# Patient Record
Sex: Male | Born: 1960 | Race: White | Hispanic: No | Marital: Married | Smoking: Never smoker
Health system: Southern US, Community
[De-identification: ages and names within clinical notes are randomized; demographics above are authoritative.]

## PROBLEM LIST (undated history)

## (undated) DIAGNOSIS — G20A1 Parkinson's disease without dyskinesia, without mention of fluctuations: Secondary | ICD-10-CM

## (undated) DIAGNOSIS — I10 Essential (primary) hypertension: Secondary | ICD-10-CM

## (undated) DIAGNOSIS — G20 Parkinson's disease: Secondary | ICD-10-CM

## (undated) DIAGNOSIS — G47419 Narcolepsy without cataplexy: Secondary | ICD-10-CM

## (undated) HISTORY — PX: VASECTOMY: SHX75

## (undated) HISTORY — PX: VARICOSE VEIN SURGERY: SHX832

---

## 2014-02-20 ENCOUNTER — Ambulatory Visit: Payer: Self-pay | Admitting: Physical Medicine and Rehabilitation

## 2015-12-02 ENCOUNTER — Emergency Department
Admission: EM | Admit: 2015-12-02 | Discharge: 2015-12-02 | Disposition: A | Discharge status: Home / Self Care | Payer: BLUE CROSS/BLUE SHIELD | Attending: Emergency Medicine | Admitting: Emergency Medicine

## 2015-12-02 ENCOUNTER — Emergency Department: Payer: BLUE CROSS/BLUE SHIELD

## 2015-12-02 ENCOUNTER — Encounter: Payer: Self-pay | Admitting: Emergency Medicine

## 2015-12-02 DIAGNOSIS — I10 Essential (primary) hypertension: Secondary | ICD-10-CM | POA: Insufficient documentation

## 2015-12-02 DIAGNOSIS — R55 Syncope and collapse: Secondary | ICD-10-CM

## 2015-12-02 DIAGNOSIS — Y9389 Activity, other specified: Secondary | ICD-10-CM | POA: Diagnosis not present

## 2015-12-02 DIAGNOSIS — S0181XA Laceration without foreign body of other part of head, initial encounter: Secondary | ICD-10-CM

## 2015-12-02 DIAGNOSIS — W1839XA Other fall on same level, initial encounter: Secondary | ICD-10-CM | POA: Diagnosis not present

## 2015-12-02 DIAGNOSIS — Y998 Other external cause status: Secondary | ICD-10-CM | POA: Diagnosis not present

## 2015-12-02 DIAGNOSIS — Y92002 Bathroom of unspecified non-institutional (private) residence single-family (private) house as the place of occurrence of the external cause: Secondary | ICD-10-CM | POA: Diagnosis not present

## 2015-12-02 DIAGNOSIS — R112 Nausea with vomiting, unspecified: Secondary | ICD-10-CM | POA: Diagnosis not present

## 2015-12-02 DIAGNOSIS — S0990XA Unspecified injury of head, initial encounter: Secondary | ICD-10-CM | POA: Diagnosis not present

## 2015-12-02 DIAGNOSIS — R197 Diarrhea, unspecified: Secondary | ICD-10-CM | POA: Diagnosis not present

## 2015-12-02 HISTORY — DX: Essential (primary) hypertension: I10

## 2015-12-02 HISTORY — DX: Parkinson's disease: G20

## 2015-12-02 HISTORY — DX: Narcolepsy without cataplexy: G47.419

## 2015-12-02 HISTORY — DX: Parkinson's disease without dyskinesia, without mention of fluctuations: G20.A1

## 2015-12-02 LAB — CBC
HCT: 44.9 % (ref 40.0–52.0)
HEMOGLOBIN: 14.8 g/dL (ref 13.0–18.0)
MCH: 30.5 pg (ref 26.0–34.0)
MCHC: 33.1 g/dL (ref 32.0–36.0)
MCV: 92.2 fL (ref 80.0–100.0)
Platelets: 278 10*3/uL (ref 150–440)
RBC: 4.87 MIL/uL (ref 4.40–5.90)
RDW: 14.3 % (ref 11.5–14.5)
WBC: 19 10*3/uL — AB (ref 3.8–10.6)

## 2015-12-02 LAB — COMPREHENSIVE METABOLIC PANEL
ALK PHOS: 100 U/L (ref 38–126)
ALT: 9 U/L — ABNORMAL LOW (ref 17–63)
ANION GAP: 10 (ref 5–15)
AST: 20 U/L (ref 15–41)
Albumin: 3.5 g/dL (ref 3.5–5.0)
BILIRUBIN TOTAL: 0.8 mg/dL (ref 0.3–1.2)
BUN: 20 mg/dL (ref 6–20)
CALCIUM: 8.6 mg/dL — AB (ref 8.9–10.3)
CO2: 25 mmol/L (ref 22–32)
Chloride: 103 mmol/L (ref 101–111)
Creatinine, Ser: 1.22 mg/dL (ref 0.61–1.24)
Glucose, Bld: 170 mg/dL — ABNORMAL HIGH (ref 65–99)
Potassium: 3.1 mmol/L — ABNORMAL LOW (ref 3.5–5.1)
SODIUM: 138 mmol/L (ref 135–145)
TOTAL PROTEIN: 6.6 g/dL (ref 6.5–8.1)

## 2015-12-02 LAB — LIPASE, BLOOD: Lipase: 19 U/L (ref 11–51)

## 2015-12-02 MED ORDER — LOPERAMIDE HCL 2 MG PO CAPS
4.0000 mg | ORAL_CAPSULE | Freq: Once | ORAL | Status: AC
  Administered 2015-12-02: 4 mg via ORAL
  Filled 2015-12-02: qty 2

## 2015-12-02 MED ORDER — SODIUM CHLORIDE 0.9 % IV BOLUS (SEPSIS)
1000.0000 mL | Freq: Once | INTRAVENOUS | Status: AC
  Administered 2015-12-02: 1000 mL via INTRAVENOUS

## 2015-12-02 MED ORDER — LIDOCAINE HCL (PF) 1 % IJ SOLN
10.0000 mL | Freq: Once | INTRAMUSCULAR | Status: AC
  Administered 2015-12-02: 10 mL via INTRADERMAL

## 2015-12-02 MED ORDER — ONDANSETRON HCL 4 MG/2ML IJ SOLN
4.0000 mg | Freq: Once | INTRAMUSCULAR | Status: AC | PRN
  Administered 2015-12-02: 4 mg via INTRAVENOUS

## 2015-12-02 MED ORDER — LIDOCAINE HCL (PF) 1 % IJ SOLN
INTRAMUSCULAR | Status: AC
  Administered 2015-12-02: 10 mL via INTRADERMAL
  Filled 2015-12-02: qty 10

## 2015-12-02 MED ORDER — ONDANSETRON HCL 4 MG/2ML IJ SOLN
4.0000 mg | Freq: Once | INTRAMUSCULAR | Status: AC | PRN
Start: 2015-12-02 — End: 2015-12-02
  Administered 2015-12-02: 4 mg via INTRAVENOUS
  Filled 2015-12-02 (×2): qty 2

## 2015-12-02 NOTE — ED Notes (Signed)
MD at bedside. 

## 2015-12-02 NOTE — ED Notes (Signed)
With MD at bedside for stitch placement.

## 2015-12-02 NOTE — ED Notes (Signed)
Pt alert and oriented X4, active, cooperative, pt in NAD. RR even and unlabored, color WNL.  Pt informed to return if any life threatening symptoms occur.   

## 2015-12-02 NOTE — ED Notes (Signed)
Pt presents to ED via [facility] from home, c/o laceration above left eyebrow, abdominal pain and diarrhea. Pt reports diarrhea began 7 pm last night. Pt reports was up using bathroom when pt became hot and flushed and states the next thing he remembers was waking up on the bathroom floor. Pt daughter reports finding pt unresponsive on the bathroom floor. Per EMS fire department found pt conscious but not alert. Upon EMS arrival pt alert and oriented x 4. Pt arrived with bandage to head, bleeding controlled. Pt alert and oriented x 4, no increased work in breathing noted, skin warm and dry.

## 2015-12-02 NOTE — ED Notes (Signed)
Patient transported to CT 

## 2015-12-02 NOTE — ED Notes (Signed)
Report from Kuch, RN 

## 2015-12-02 NOTE — Discharge Instructions (Signed)
Diarrhea Diarrhea is frequent loose and watery bowel movements. It can cause you to feel weak and dehydrated. Dehydration can cause you to become tired and thirsty, have a dry mouth, and have decreased urination that often is dark yellow. Diarrhea is a sign of another problem, most often an infection that will not last long. In most cases, diarrhea typically lasts 2-3 days. However, it can last longer if it is a sign of something more serious. It is important to treat your diarrhea as directed by your caregiver to lessen or prevent future episodes of diarrhea. CAUSES  Some common causes include:  Gastrointestinal infections caused by viruses, bacteria, or parasites.  Food poisoning or food allergies.  Certain medicines, such as antibiotics, chemotherapy, and laxatives.  Artificial sweeteners and fructose.  Digestive disorders. HOME CARE INSTRUCTIONS  Ensure adequate fluid intake (hydration): Have 1 cup (8 oz) of fluid for each diarrhea episode. Avoid fluids that contain simple sugars or sports drinks, fruit juices, whole milk products, and sodas. Your urine should be clear or pale yellow if you are drinking enough fluids. Hydrate with an oral rehydration solution that you can purchase at pharmacies, retail stores, and online. You can prepare an oral rehydration solution at home by mixing the following ingredients together:   - tsp table salt.   tsp baking soda.   tsp salt substitute containing potassium chloride.  1  tablespoons sugar.  1 L (34 oz) of water.  Certain foods and beverages may increase the speed at which food moves through the gastrointestinal (GI) tract. These foods and beverages should be avoided and include:  Caffeinated and alcoholic beverages.  High-fiber foods, such as raw fruits and vegetables, nuts, seeds, and whole grain breads and cereals.  Foods and beverages sweetened with sugar alcohols, such as xylitol, sorbitol, and mannitol.  Some foods may be well  tolerated and may help thicken stool including:  Starchy foods, such as rice, toast, pasta, low-sugar cereal, oatmeal, grits, baked potatoes, crackers, and bagels.  Bananas.  Applesauce.  Add probiotic-rich foods to help increase healthy bacteria in the GI tract, such as yogurt and fermented milk products.  Wash your hands well after each diarrhea episode.  Only take over-the-counter or prescription medicines as directed by your caregiver.  Take a warm bath to relieve any burning or pain from frequent diarrhea episodes. SEEK IMMEDIATE MEDICAL CARE IF:   You are unable to keep fluids down.  You have persistent vomiting.  You have blood in your stool, or your stools are black and tarry.  You do not urinate in 6-8 hours, or there is only a small amount of very dark urine.  You have abdominal pain that increases or localizes.  You have weakness, dizziness, confusion, or light-headedness.  You have a severe headache.  Your diarrhea gets worse or does not get better.  You have a fever or persistent symptoms for more than 2-3 days.  You have a fever and your symptoms suddenly get worse. MAKE SURE YOU:   Understand these instructions.  Will watch your condition.  Will get help right away if you are not doing well or get worse.   This information is not intended to replace advice given to you by your health care provider. Make sure you discuss any questions you have with your health care provider.   Document Released: 09/22/2002 Document Revised: 10/23/2014 Document Reviewed: 06/09/2012 Elsevier Interactive Patient Education 2016 [employer]  Facial Laceration Please have sutures removed in 7 days  A  facial laceration is a cut on the face. These injuries can be painful and cause bleeding. Lacerations usually heal quickly, but they need special care to reduce scarring. DIAGNOSIS  Your health care provider will take a medical history, ask for details about how the injury  occurred, and examine the wound to determine how deep the cut is. TREATMENT  Some facial lacerations may not require closure. Others may not be able to be closed because of an increased risk of infection. The risk of infection and the chance for successful closure will depend on various factors, including the amount of time since the injury occurred. The wound may be cleaned to help prevent infection. If closure is appropriate, pain medicines may be given if needed. Your health care provider will use stitches (sutures), wound glue (adhesive), or skin adhesive strips to repair the laceration. These tools bring the skin edges together to allow for faster healing and a better cosmetic outcome. If needed, you may also be given a tetanus shot. HOME CARE INSTRUCTIONS  Only take over-the-counter or prescription medicines as directed by your health care provider.  Follow your health care provider's instructions for wound care. These instructions will vary depending on the technique used for closing the wound. For Sutures:  Keep the wound clean and dry.   If you were given a bandage (dressing), you should change it at least once a day. Also change the dressing if it becomes wet or dirty, or as directed by your health care provider.   Wash the wound with soap and water 2 times a day. Rinse the wound off with water to remove all soap. Pat the wound dry with a clean towel.   After cleaning, apply a thin layer of the antibiotic ointment recommended by your health care provider. This will help prevent infection and keep the dressing from sticking.   You may shower as usual after the first 24 hours. Do not soak the wound in water until the sutures are removed.   Get your sutures removed as directed by your health care provider. With facial lacerations, sutures should usually be taken out after 4-5 days to avoid stitch marks.   Wait a few days after your sutures are removed before applying any  makeup. For Skin Adhesive Strips:  Keep the wound clean and dry.   Do not get the skin adhesive strips wet. You may bathe carefully, using caution to keep the wound dry.   If the wound gets wet, pat it dry with a clean towel.   Skin adhesive strips will fall off on their own. You may trim the strips as the wound heals. Do not remove skin adhesive strips that are still stuck to the wound. They will fall off in time.  For Wound Adhesive:  You may briefly wet your wound in the shower or bath. Do not soak or scrub the wound. Do not swim. Avoid periods of heavy sweating until the skin adhesive has fallen off on its own. After showering or bathing, gently pat the wound dry with a clean towel.   Do not apply liquid medicine, cream medicine, ointment medicine, or makeup to your wound while the skin adhesive is in place. This may loosen the film before your wound is healed.   If a dressing is placed over the wound, be careful not to apply tape directly over the skin adhesive. This may cause the adhesive to be pulled off before the wound is healed.   Avoid prolonged exposure to  sunlight or tanning lamps while the skin adhesive is in place.  The skin adhesive will usually remain in place for 5-10 days, then naturally fall off the skin. Do not pick at the adhesive film.  After Healing: Once the wound has healed, cover the wound with sunscreen during the day for 1 full year. This can help minimize scarring. Exposure to ultraviolet light in the first year will darken the scar. It can take 1-2 years for the scar to lose its redness and to heal completely.  SEEK MEDICAL CARE IF:  You have a fever. SEEK IMMEDIATE MEDICAL CARE IF:  You have redness, pain, or swelling around the wound.   You see ayellowish-white fluid (pus) coming from the wound.    This information is not intended to replace advice given to you by your health care provider. Make sure you discuss any questions you have with  your health care provider.   Document Released: 11/09/2004 Document Revised: 10/23/2014 Document Reviewed: 05/15/2013 Elsevier Interactive Patient Education 2016 [employer].  Syncope, commonly known as fainting, is a temporary loss of consciousness. It occurs when the blood flow to the brain is reduced. Vasovagal syncope (also called neurocardiogenic syncope) is a fainting spell in which the blood flow to the brain is reduced because of a sudden drop in heart rate and blood pressure. Vasovagal syncope occurs when the brain and the cardiovascular system (blood vessels) do not adequately communicate and respond to each other. This is the most common cause of fainting. It often occurs in response to fear or some other type of emotional or physical stress. The body has a reaction in which the heart starts beating too slowly or the blood vessels expand, reducing blood pressure. This type of fainting spell is generally considered harmless. However, injuries can occur if a person takes a sudden fall during a fainting spell.  CAUSES  Vasovagal syncope occurs when a person's blood pressure and heart rate decrease suddenly, usually in response to a trigger. Many things and situations can trigger an episode. Some of these include:   Pain.   Fear.   The sight of blood or medical procedures, such as blood being drawn from a vein.   Common activities, such as coughing, swallowing, stretching, or going to the bathroom.   Emotional stress.   Prolonged standing, especially in a warm environment.   Lack of sleep or rest.   Prolonged lack of food.   Prolonged lack of fluids.   Recent illness.  The use of certain drugs that affect blood pressure, such as cocaine, alcohol, marijuana, inhalants, and opiates.  SYMPTOMS  Before the fainting episode, you may:   Feel dizzy or light headed.   Become pale.  Sense that you are going to faint.   Feel like the room is spinning.   Have  tunnel vision, only seeing directly in front of you.   Feel sick to your stomach (nauseous).   See spots or slowly lose vision.   Hear ringing in your ears.   Have a headache.   Feel warm and sweaty.   Feel a sensation of pins and needles. During the fainting spell, you will generally be unconscious for no longer than a couple minutes before waking up and returning to normal. If you get up too quickly before your body can recover, you may faint again. Some twitching or jerky movements may occur during the fainting spell.  DIAGNOSIS  Your health care provider will ask about your symptoms, take a  medical history, and perform a physical exam. Various tests may be done to rule out other causes of fainting. These may include blood tests and tests to check the heart, such as electrocardiography, echocardiography, and possibly an electrophysiology study. When other causes have been ruled out, a test may be done to check the body's response to changes in position (tilt table test). TREATMENT  Most cases of vasovagal syncope do not require treatment. Your health care provider may recommend ways to avoid fainting triggers and may provide home strategies for preventing fainting. If you must be exposed to a possible trigger, you can drink additional fluids to help reduce your chances of having an episode of vasovagal syncope. If you have warning signs of an oncoming episode, you can respond by positioning yourself favorably (lying down). If your fainting spells continue, you may be given medicines to prevent fainting. Some medicines may help make you more resistant to repeated episodes of vasovagal syncope. Special exercises or compression stockings may be recommended. In rare cases, the surgical placement of a pacemaker is considered. HOME CARE INSTRUCTIONS   Learn to identify the warning signs of vasovagal syncope.   Sit or lie down at the first warning sign of a fainting spell. If sitting, put  your head down between your legs. If you lie down, swing your legs up in the air to increase blood flow to the brain.   Avoid hot tubs and saunas.  Avoid prolonged standing.  Drink enough fluids to keep your urine clear or pale yellow. Avoid caffeine.  Increase salt in your diet as directed by your health care provider.   If you have to stand for a long time, perform movements such as:   Crossing your legs.   Flexing and stretching your leg muscles.   Squatting.   Moving your legs.   Bending over.   Only take over-the-counter or prescription medicines as directed by your health care provider. Do not suddenly stop any medicines without asking your health care provider first. SEEK MEDICAL CARE IF:   Your fainting spells continue or happen more frequently in spite of treatment.   You lose consciousness for more than a couple minutes.  You have fainting spells during or after exercising or after being startled.   You have new symptoms that occur with the fainting spells, such as:   Shortness of breath.  Chest pain.   Irregular heartbeat.   You have episodes of twitching or jerky movements that last longer than a few seconds.  You have episodes of twitching or jerky movements without obvious fainting. SEEK IMMEDIATE MEDICAL CARE IF:   You have injuries or bleeding after a fainting spell.   You have episodes of twitching or jerky movements that last longer than 5 minutes.   You have more than one spell of twitching or jerky movements before returning to consciousness after fainting.   This information is not intended to replace advice given to you by your health care provider. Make sure you discuss any questions you have with your health care provider.   Document Released: 09/18/2012 Document Revised: 02/16/2015 Document Reviewed: 09/18/2012 Elsevier Interactive Patient Education [employer]

## 2015-12-02 NOTE — ED Provider Notes (Signed)
[facility] Emergency Department Provider Note  ____________________________________________  Time seen: 2:05 AM  I have reviewed the triage vital signs and the nursing notes.   HISTORY  Chief Complaint Loss of Consciousness and Abdominal Pain      HPI Jay Saunders is a 55 y.o. male presents via EMS status post syncopal episode while having diarrhea tonight. Patient states he has been having diarrhea every 30 minutes since 7 PM last night. He states that he awoke from bed and went to the bathroom and was having diarrhea and then "passed out". Patient denies any preceding symptoms other than diarrhea before the syncopal event. Patient denies any abdominal pain but does admit to nausea and vomiting. Patient denies any fever. Per the patient's wife on arrival to the bathroom the patient's face was on the floor with evidence of bleeding. She states that her husband was nonresponsive to verbal stimuli at that time but gradually came around and became appropriate.     Past Medical History  Diagnosis Date  . Parkinson's disease (HCC)   . Hypertension   . Narcolepsy     There are no active problems to display for this patient.   History reviewed. No pertinent past surgical history.  No current outpatient prescriptions on file.  Allergies Review of patient's allergies indicates no known allergies.  No family history on file.  Social History Social History  Substance Use Topics  . Smoking status: Never Smoker   . Smokeless tobacco: None  . Alcohol Use: No    Review of Systems  Constitutional: Negative for fever. Eyes: Negative for visual changes. ENT: Negative for sore throat. Cardiovascular: Negative for chest pain. Respiratory: Negative for shortness of breath. Gastrointestinal: Negative for abdominal pain. Positive for vomiting and diarrhea Genitourinary: Negative for dysuria. Musculoskeletal: Negative for back pain. Skin: Negative for rash.  Positive for forehead laceration Neurological: Negative for headaches, focal weakness or numbness. Positive for syncope   10-point ROS otherwise negative.  ____________________________________________   PHYSICAL EXAM:  VITAL SIGNS: ED Triage Vitals  Enc Vitals Group     BP 12/02/15 0146 105/70 mmHg     Pulse Rate 12/02/15 0145 68     Resp 12/02/15 0145 20     Temp 12/02/15 0146 97.8 F (36.6 C)     Temp Source 12/02/15 0146 Oral     SpO2 12/02/15 0145 96 %     Weight 12/02/15 0146 265 lb (120.203 kg)     Height 12/02/15 0146 6' (1.829 m)     Head Cir --      Peak Flow --      Pain Score 12/02/15 0147 4     Pain Loc --      Pain Edu? --      Excl. in GC? --      Constitutional: Alert and oriented. Well appearing and in no distress. Eyes: Conjunctivae are normal. PERRL. Normal extraocular movements. ENT   Head: Normocephalic and atraumatic.   Nose: No congestion/rhinnorhea.   Mouth/Throat: Mucous membranes are moist.   Neck: No stridor. Hematological/Lymphatic/Immunilogical: No cervical lymphadenopathy. Cardiovascular: Normal rate, regular rhythm. Normal and symmetric distal pulses are present in all extremities. No murmurs, rubs, or gallops. Respiratory: Normal respiratory effort without tachypnea nor retractions. Breath sounds are clear and equal bilaterally. No wheezes/rales/rhonchi. Gastrointestinal: Soft and nontender. No distention. There is no CVA tenderness. Genitourinary: deferred Musculoskeletal: Nontender with normal range of motion in all extremities. No joint effusions.  No lower extremity tenderness nor edema.  Neurologic:  Normal speech and language. No gross focal neurologic deficits are appreciated. Speech is normal.  Skin:  Skin is warm, dry and intact. 1 inch laceration medial to the left eyebrow Psychiatric: Mood and affect are normal. Speech and behavior are normal. Patient exhibits appropriate insight and  judgment.  ____________________________________________    LABS (pertinent positives/negatives)  Labs Reviewed  COMPREHENSIVE METABOLIC PANEL - Abnormal; Notable for the following:    Potassium 3.1 (*)    Glucose, Bld 170 (*)    Calcium 8.6 (*)    ALT 9 (*)    All other components within normal limits  CBC - Abnormal; Notable for the following:    WBC 19.0 (*)    All other components within normal limits  LIPASE, BLOOD  URINALYSIS COMPLETEWITH MICROSCOPIC ([facility] ONLY)     ____________________________________________   EKG ED ECG REPORT I, BROWN, [place], the attending physician, personally viewed and interpreted this ECG.   Date: 12/02/2015  EKG Time: 1:41 AM  Rate: 91  Rhythm: Normal sinus rhythm  Axis: Normal  Intervals: Normal  ST&T Change: None   ____________________________________________    RADIOLOGY CT Head Wo Contrast (Final result) Result time: 12/02/15 02:46:39   Final result by Rad Results In Interface (12/02/15 02:46:39)   Narrative:   CLINICAL DATA: 55 year old male with fall and left forehead laceration  EXAM: CT HEAD WITHOUT CONTRAST  TECHNIQUE: Contiguous axial images were obtained from the base of the skull through the vertex without intravenous contrast.  COMPARISON: Brain MRI dated 02/20/2014  FINDINGS: The ventricles and sulci are appropriate in size for patient's age. Minimal periventricular and deep white matter chronic microvascular ischemic changes noted. There is no acute intracranial hemorrhage. No mass effect or midline shift.  There is diffuse mucoperiosteal thickening of paranasal sinuses with partial opacification of the maxillary sinuses. The mastoid air cells are clear. The calvarium is intact. Left forehead skin laceration.  IMPRESSION: No acute intracranial hemorrhage.   Electronically Signed By: Elgie Collard M.D. On: 12/02/2015 02:46      Procedure note:LACERATION REPAIR Performed by:  Darci Current Authorized by: Darci Current Consent: Verbal consent obtained. Risks and benefits: risks, benefits and alternatives were discussed Consent given by: patient Patient identity confirmed: provided demographic data Prepped and Draped in normal sterile fashion Wound explored  Laceration Location: left forehead   Laceration Length: 1 inch  No Foreign Bodies seen or palpated  Anesthesia: local infiltration  Local anesthetic: lidocaine 1%   Anesthetic total: 5 ml  Irrigation method: syringe Amount of cleaning: standard  Skin closure: 6-0 nylon   Number of sutures:  10  Technique: Simple Interrupted  Patient tolerance: Patient tolerated the procedure well with no immediate complications.   INITIAL IMPRESSION / ASSESSMENT AND PLAN / ED COURSE  Pertinent labs & imaging results that were available during my care of the patient were reviewed by me and considered in my medical decision making (see chart for details).  History of physical exam consistent with vasovagal syncope, diarrhea most likely viral etiology and facial laceration secondary to syncopal episode.  ____________________________________________   FINAL CLINICAL IMPRESSION(S) / ED DIAGNOSES  Final diagnoses:  Forehead laceration, initial encounter  Vasovagal syncope  Diarrhea, unspecified type      Darci Current, MD 12/02/15 [phone]

## 2016-03-27 ENCOUNTER — Other Ambulatory Visit: Payer: Self-pay | Admitting: Family Medicine

## 2016-03-27 ENCOUNTER — Ambulatory Visit
Admission: RE | Admit: 2016-03-27 | Discharge: 2016-03-27 | Disposition: A | Discharge status: Home / Self Care | Payer: BLUE CROSS/BLUE SHIELD | Source: Ambulatory Visit | Attending: Family Medicine | Admitting: Family Medicine

## 2016-03-27 DIAGNOSIS — M79604 Pain in right leg: Secondary | ICD-10-CM | POA: Diagnosis present

## 2016-03-27 DIAGNOSIS — I82811 Embolism and thrombosis of superficial veins of right lower extremities: Secondary | ICD-10-CM | POA: Insufficient documentation

## 2016-09-03 IMAGING — US US EXTREM LOW VENOUS*R*
1 series · 13 of 24 positions shown · non-contrast
Comparison: None.

CLINICAL DATA: Right lower extremity pain for 1 day

EXAM:
RIGHT LOWER EXTREMITY VENOUS DUPLEX ULTRASOUND
TECHNIQUE: Gray-scale sonography with graded compression, as well as color
Doppler and duplex ultrasound were performed to evaluate the right
lower extremity deep venous system from the level of the common
femoral vein and including the common femoral, femoral, profunda
femoral, popliteal and calf veins including the posterior tibial,
peroneal and gastrocnemius veins when visible. The superficial great
saphenous vein was also interrogated. Spectral Doppler was utilized
to evaluate flow at rest and with distal augmentation maneuvers in
the common femoral, femoral and popliteal veins.

[Series 1: us extrem low venous*right* · 0.09mm/px · 13 of 49 slices shown]
[im 1/49]
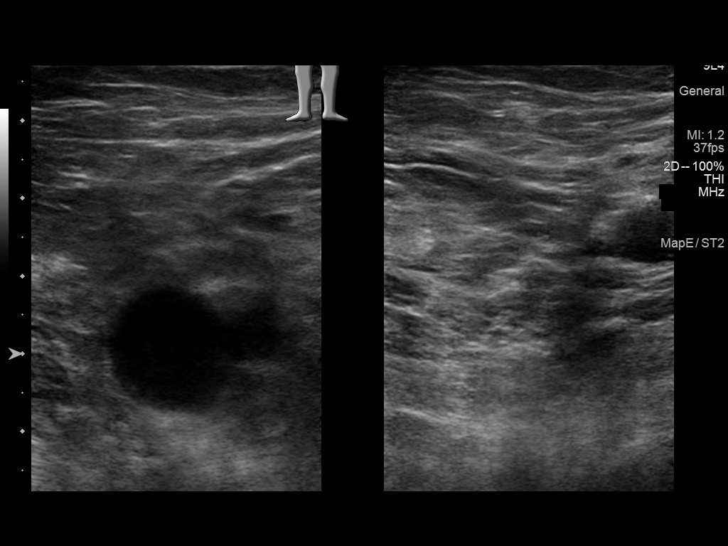
[im 5/49]
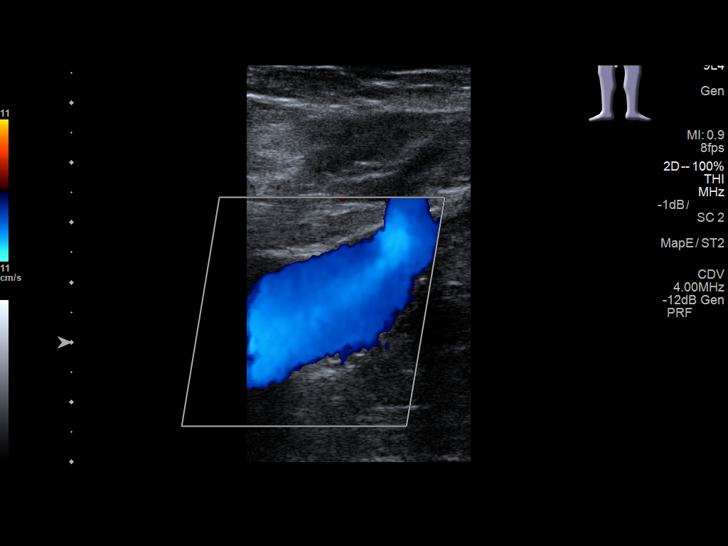
[im 9/49]
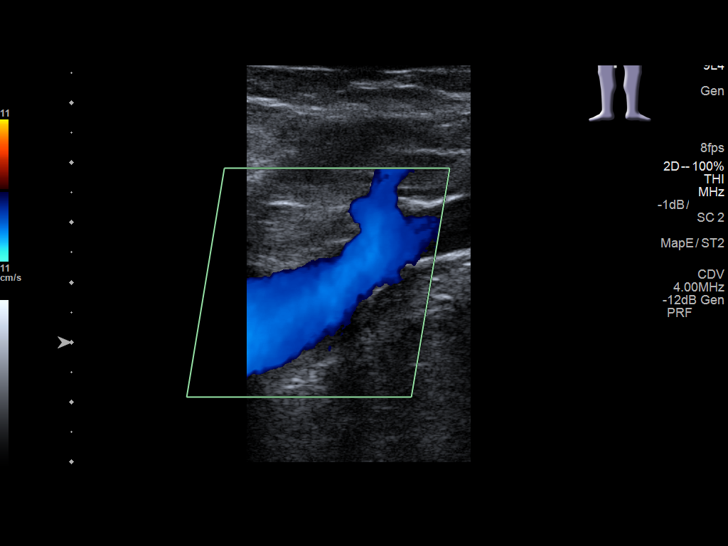
[im 13/49]
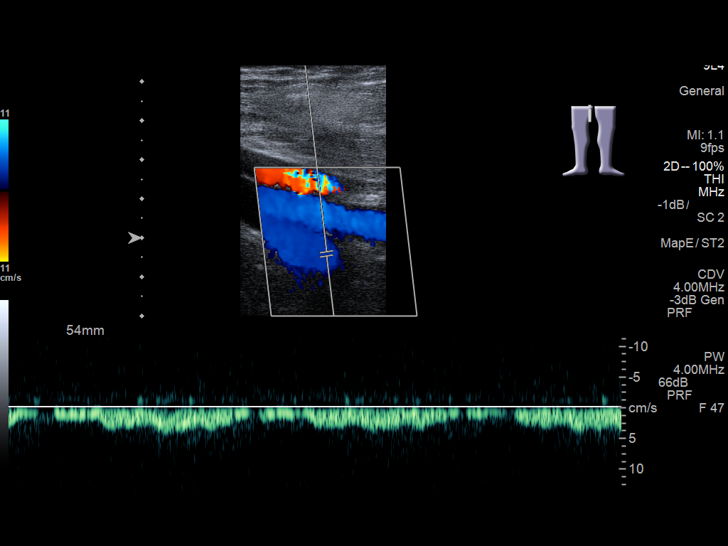
[im 17/49]
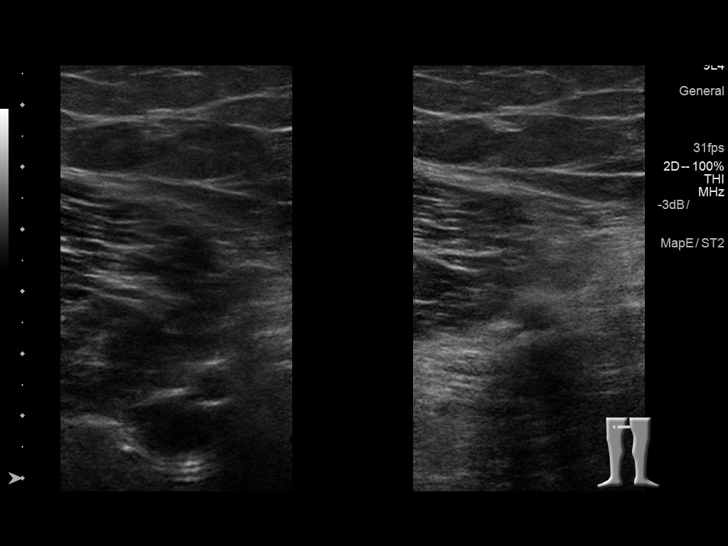
[im 21/49]
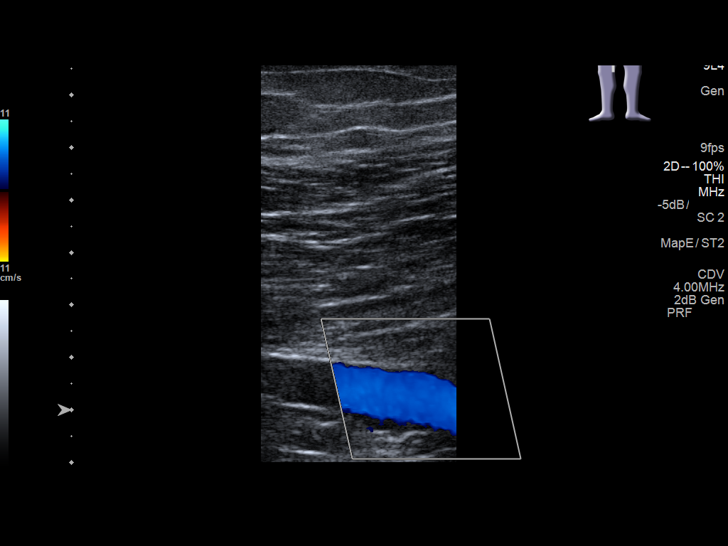
[im 26/49]
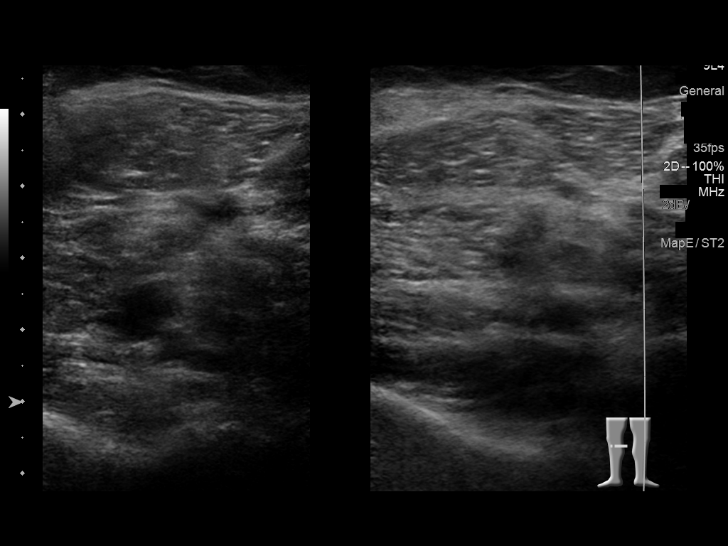
[im 28/49]
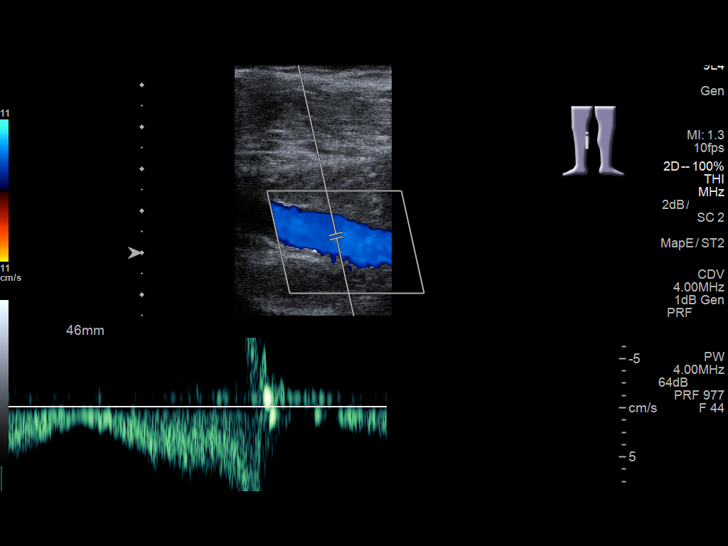
[im 32/49]
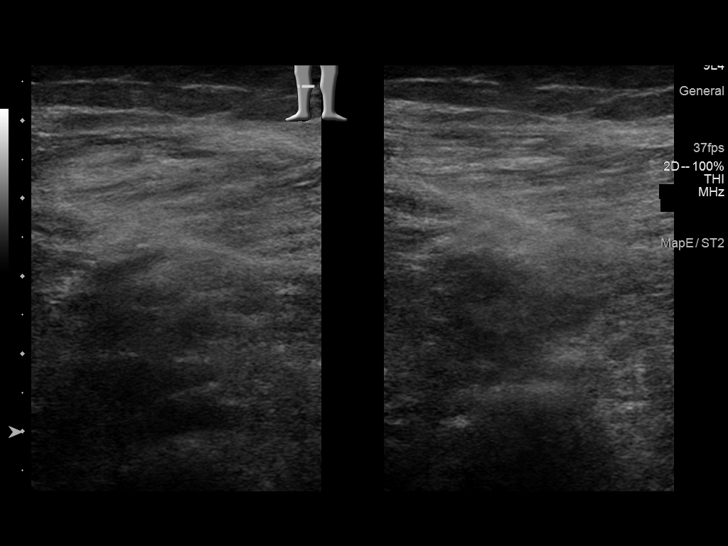
[im 36/49]
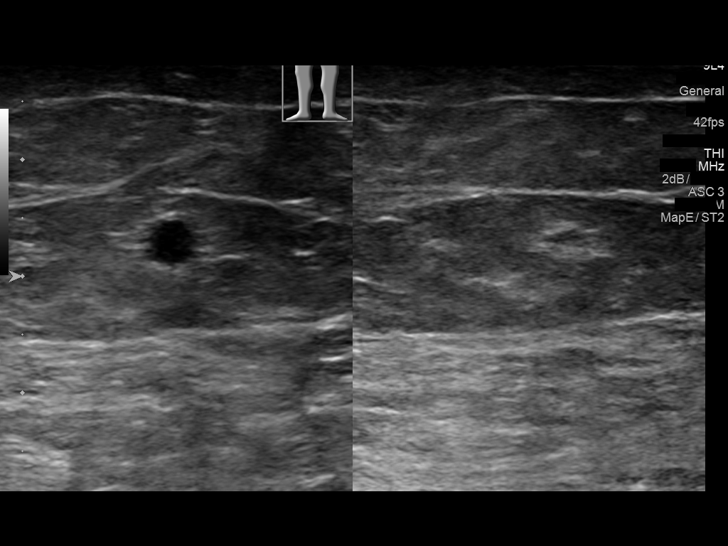
[im 40/49]
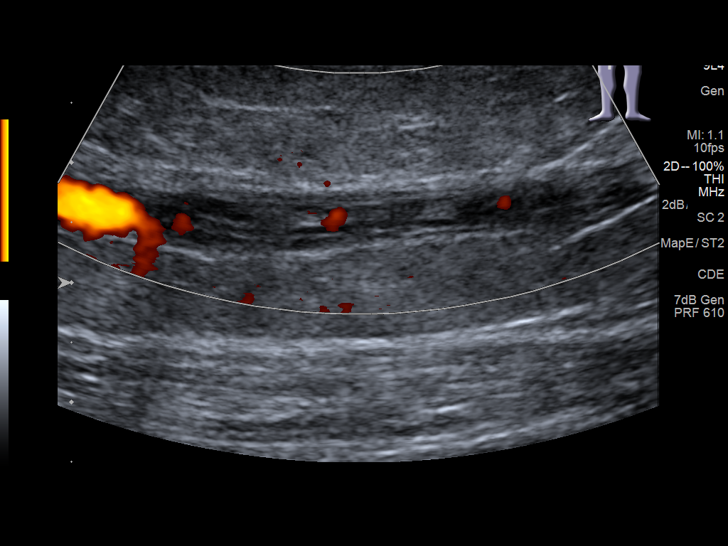
[im 44/49]
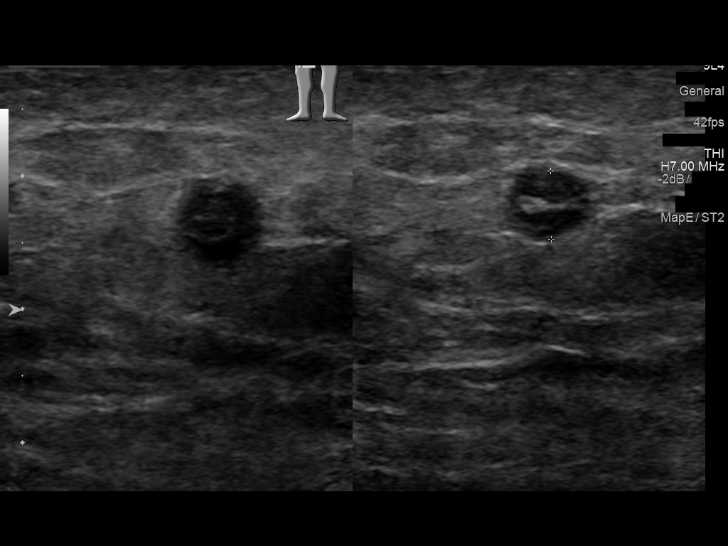
[im 49/49]
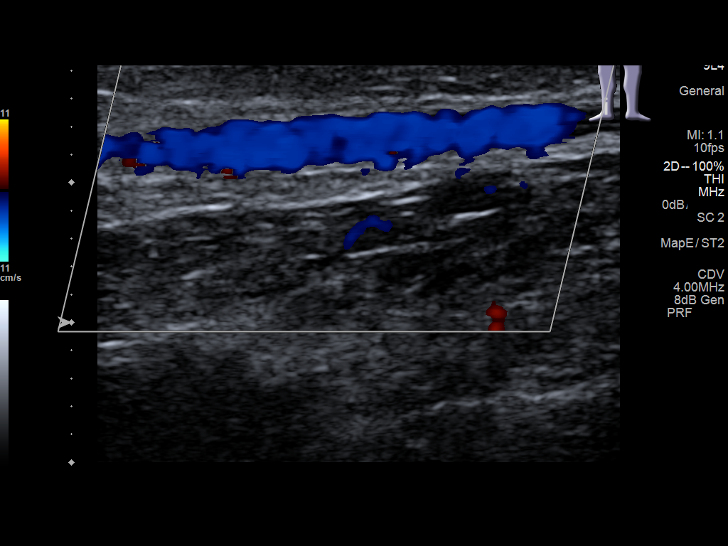

[13 of 24 positions shown; findings below may reference images not displayed]

FINDINGS: Contralateral Common Femoral Vein: Respiratory phasicity is normal
and symmetric with the symptomatic side. No evidence of thrombus.
Normal compressibility.

Common Femoral Vein: No evidence of thrombus. Normal
compressibility, respiratory phasicity and response to augmentation.

Saphenofemoral Junction: No evidence of thrombus. Normal
compressibility and flow on color Doppler imaging.

Profunda Femoral Vein: No evidence of thrombus. Normal
compressibility and flow on color Doppler imaging.

Femoral Vein: No evidence of thrombus. Normal compressibility,
respiratory phasicity and response to augmentation.

Popliteal Vein: No evidence of thrombus. Normal compressibility,
respiratory phasicity and response to augmentation.

Calf Veins: No evidence of thrombus. Normal compressibility and flow
on color Doppler imaging.

Superficial Great Saphenous Vein: There is acute appearing thrombus
in the right greater saphenous vein from the midthigh to the
proximal calf region with only minimal flow through this area noted.
There is no compression and augmentation in this area. There is only
minimal Doppler flow in this area.

Venous Reflux:  None.

Other Findings:  None.
IMPRESSION: No evidence of right lower extremity deep venous thrombosis. Left
common femoral vein patent. There is superficial venous thrombosis
in the right greater saphenous vein extending from the proximal calf
region to the mid thigh. The saphenofemoral junction region on the
right shows no thrombus.

These results will be called to the ordering clinician or
representative by the Radiologist Assistant, and communication
documented in the PACS or zVision Dashboard.

## 2016-11-01 IMAGING — CT CT HEAD W/O CM
3 series · 17 of 30 positions shown, 18 images · non-contrast
Comparison: Brain MRI dated 02/20/2014

CLINICAL DATA: 54-year-old male with fall and left forehead
laceration

EXAM:
CT HEAD WITHOUT CONTRAST
TECHNIQUE: Contiguous axial images were obtained from the base of the skull
through the vertex without intravenous contrast.

[Series 2: head wo · axial · 0.43mm/px · z∈[-48,+62]mm · 5 of 34 slices shown]
[im 6/34  brain]
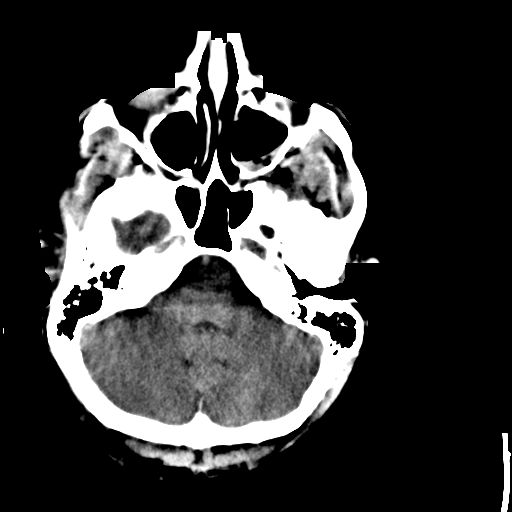
[im 12/34  brain]
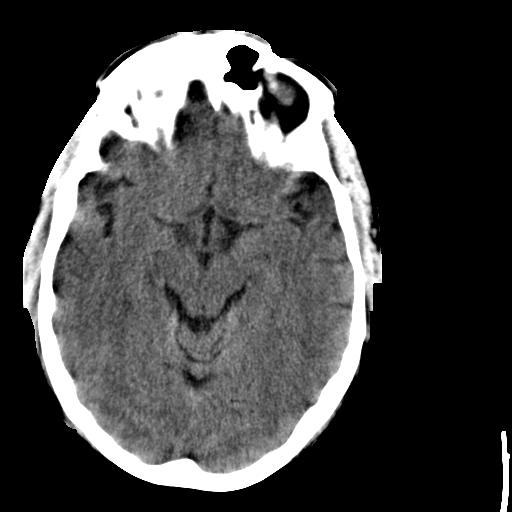
[im 17/34  brain]
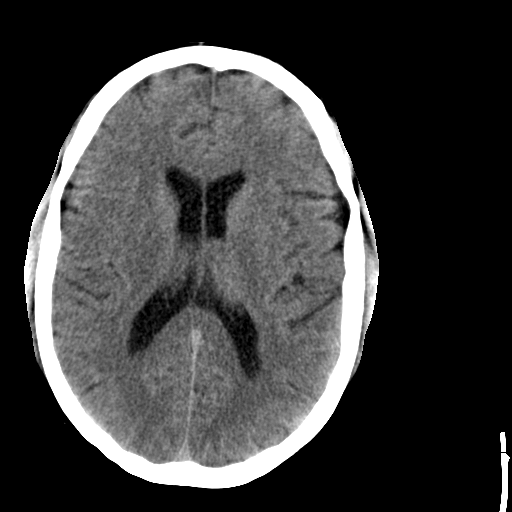
[im 23/34  brain]
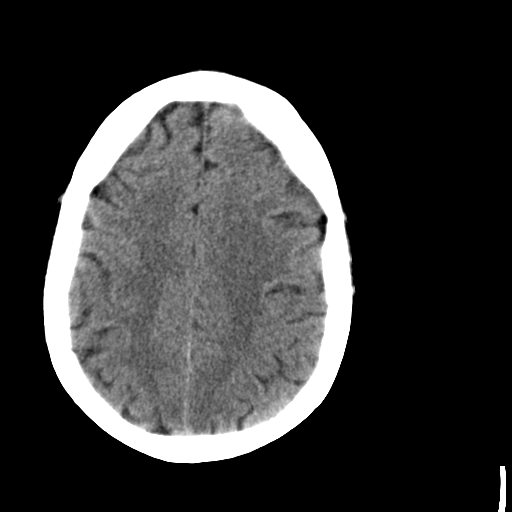
[im 28/34  brain]
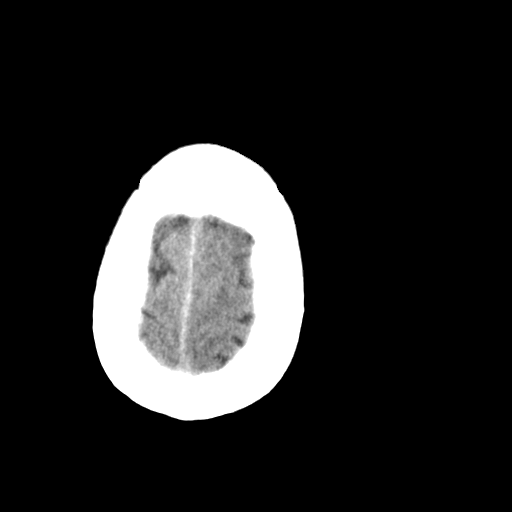

[Series 3: head bone · axial · 0.43mm/px · z∈[-74,+84]mm · 8 of 91 slices shown]
[im 6/91  bone]
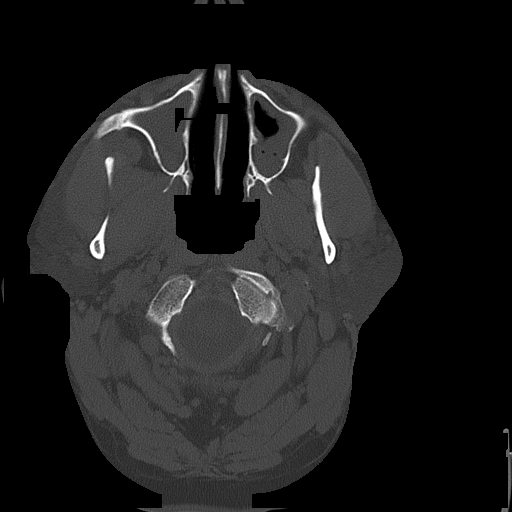
[im 17/91  bone]
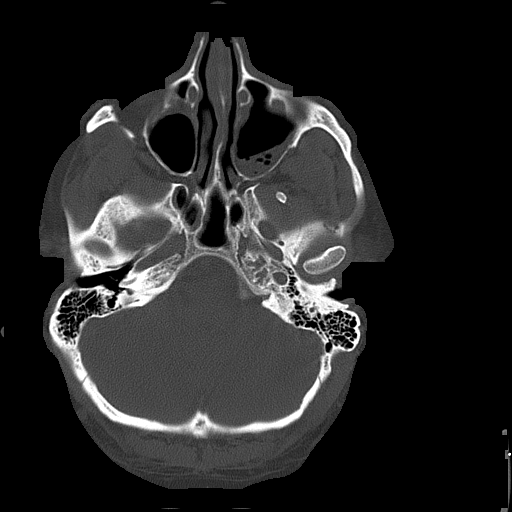
[im 29/91  bone]
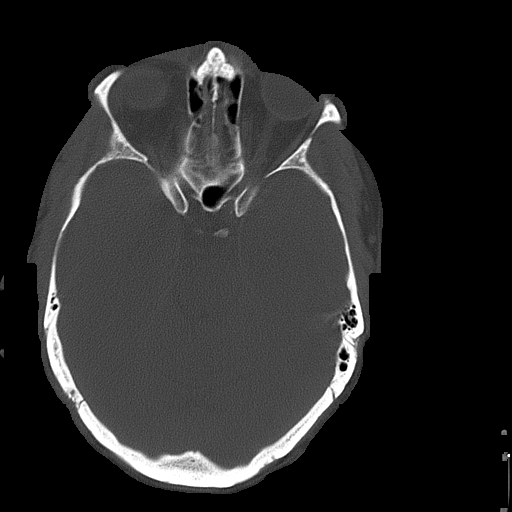
[im 40/91  bone]
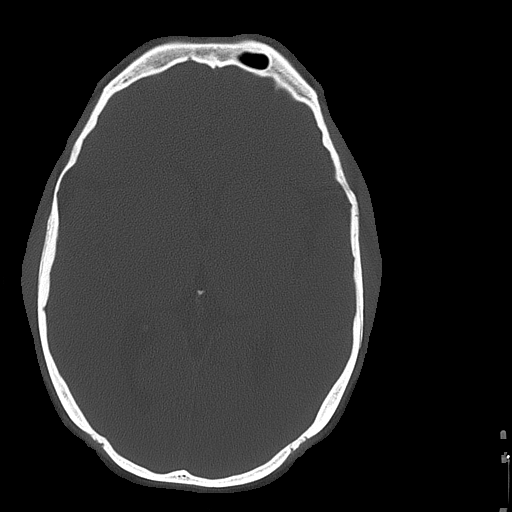
[im 51/91  bone]
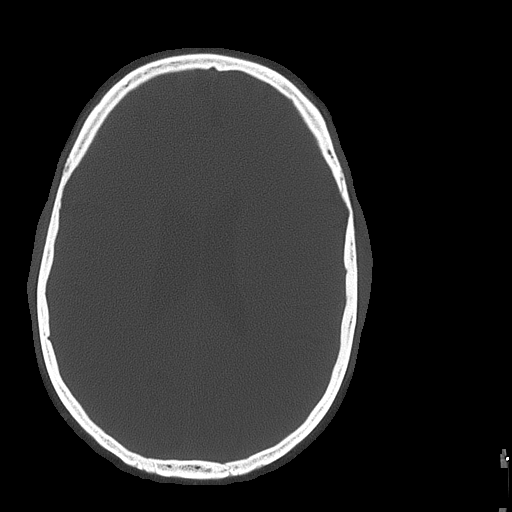
[im 62/91  bone]
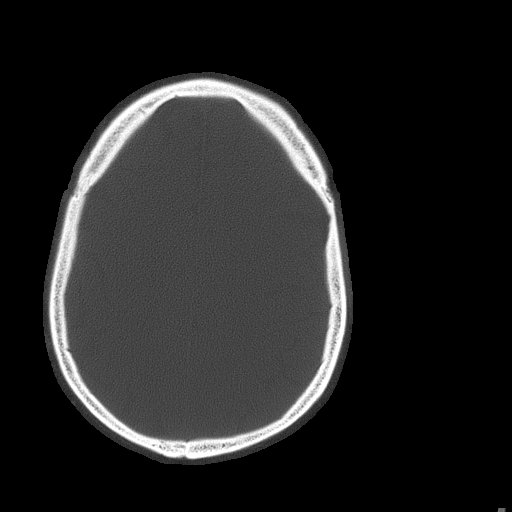
[im 74/91  bone]
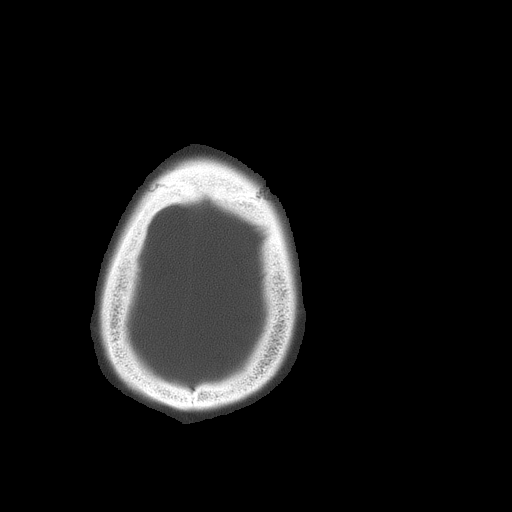
[im 85/91  bone]
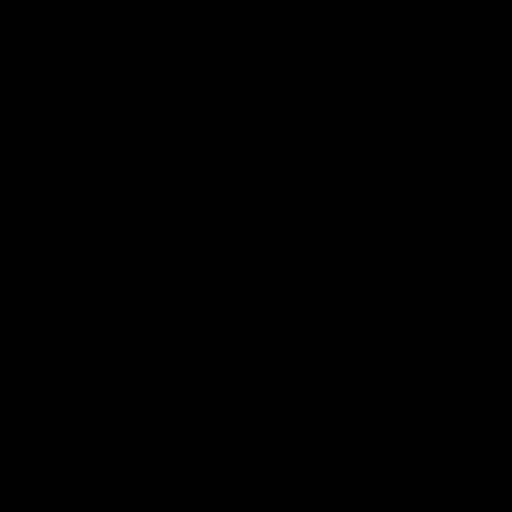

[Series 4: head wo recon · axial · 0.43mm/px · z∈[-28,+69]mm · 4 of 34 slices shown, 5 images]
[im 7/34  brain]
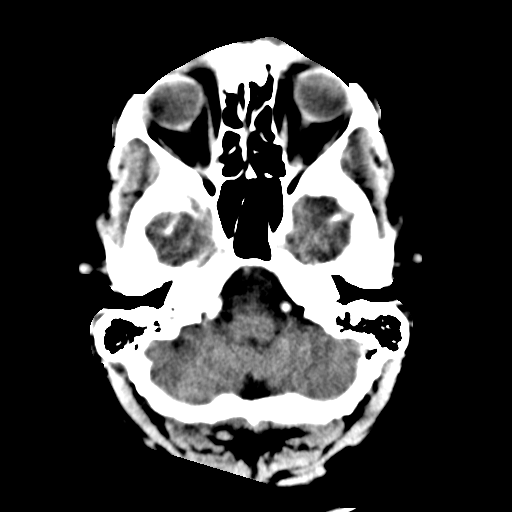
[im 7/34  bone]
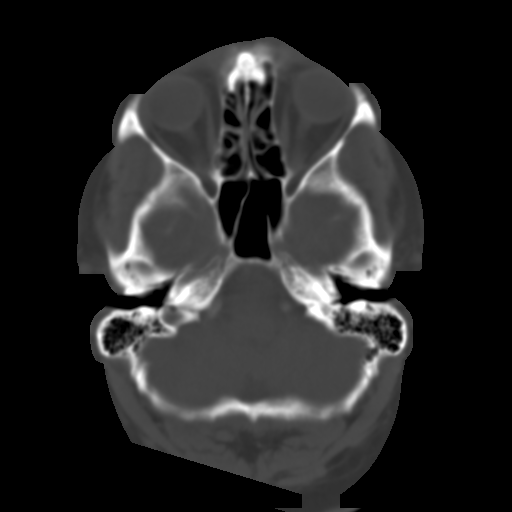
[im 14/34  brain]
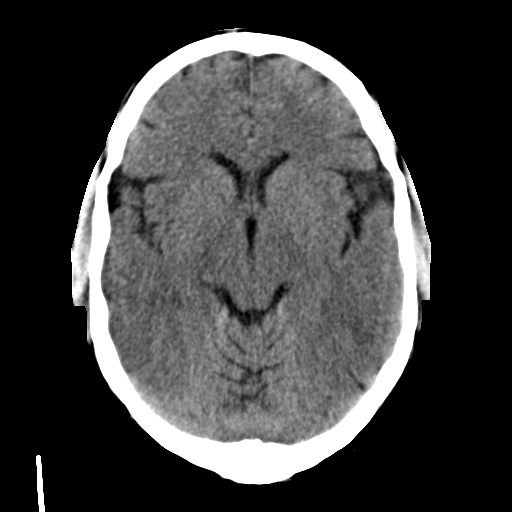
[im 20/34  brain]
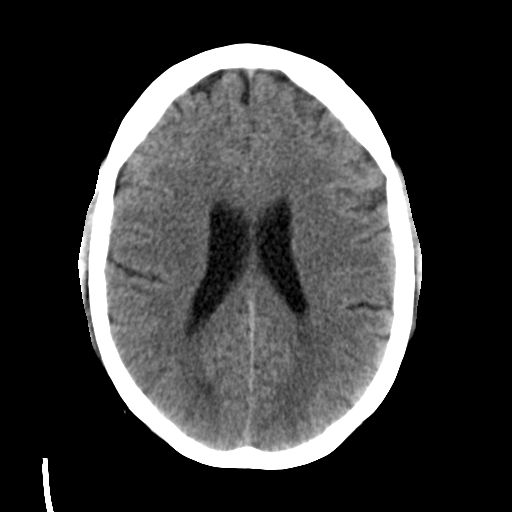
[im 27/34  brain]
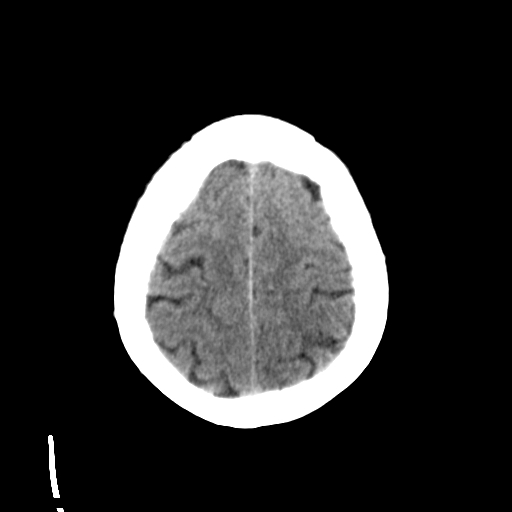

[17 of 30 positions shown; findings below may reference images not displayed]

FINDINGS: The ventricles and sulci are appropriate in size for patient's age.
Minimal periventricular and deep white matter chronic microvascular
ischemic changes noted. There is no acute intracranial hemorrhage.
No mass effect or midline shift.

There is diffuse mucoperiosteal thickening of paranasal sinuses with
partial opacification of the maxillary sinuses. The mastoid air
cells are clear. The calvarium is intact. Left forehead skin
laceration.
IMPRESSION: No acute intracranial hemorrhage.

## 2017-04-30 ENCOUNTER — Encounter: Payer: Self-pay | Admitting: Neurology

## 2017-05-24 NOTE — Progress Notes (Deleted)
Ciro BackerJon Kanitz was seen today in the movement disorders clinic for neurologic consultation for the evaluation of Parkinson's disease.  The patient has previously seen Dr. Sherryll BurgerShah, Dr. Ardyth ManHickey and Dr. Fidela Juneauooney.  I have reviewed records made available to me.  He began to see Dr. Ardyth ManHickey in May, 2015.  Symptoms began at the end of 2014.  His first symptom was noticing that his left arm was flexed against his body when he would walk.  He saw Dr. Sherryll BurgerShah initially who recommended levodopa.  He sought a second opinion in Dr. Ardyth ManHickey, who also recommended levodopa.  He started levodopa in May, 2015 at the age of 56 years old.  His PD has been complicated by depression.  He has tried zoloft, wellbutrin, effexor, paxil, adderrall.  He has EDS.  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  He did not follow through with this.   He is currently on carbidopa/levodopa 25/100 2 tablets at 6:30 am, 1 tablet at 11 am, 1.5 tablets at 3 pm, 1 tablet at 7 pm and 1 at bedtime,  selegeline transdermal 6mg  daily,     Specific Symptoms:  Tremor: {yes no:314532} Family hx of similar:  {yes no:314532} Voice: *** Sleep: ***  Vivid Dreams:  {yes no:314532}  Acting out dreams:  {yes no:314532} Wet Pillows: {yes no:314532} Postural symptoms:  {yes no:314532}  Falls?  {yes no:314532} Bradykinesia symptoms: {parkinson brady:18041} Loss of smell:  {yes no:314532} Loss of taste:  {yes no:314532} Urinary Incontinence:  {yes no:314532} Difficulty Swallowing:  {yes no:314532} Handwriting, micrographia: {yes no:314532} Trouble with ADL's:  {yes no:314532}  Trouble buttoning clothing: {yes no:314532} Depression:  {yes no:314532} Memory changes:  {yes no:314532} Hallucinations:  {yes no:314532}  visual distortions: {yes no:314532} N/V:  {yes no:314532} Lightheaded:  {yes no:314532}  Syncope: {yes  no:314532} Diplopia:  {yes no:314532} Dyskinesia:  {yes no:314532}  Neuroimaging has *** previously been performed.  It *** available for my review today.  PREVIOUS MEDICATIONS: {Parkinson's RX:18200}  ALLERGIES:  No Known Allergies  CURRENT MEDICATIONS:  No outpatient encounter prescriptions on file as of 05/28/2017.   No facility-administered encounter medications on file as of 05/28/2017.     PAST MEDICAL HISTORY:   Past Medical History:  Diagnosis Date  . Hypertension   . Narcolepsy   . Parkinson's disease (HCC)     PAST SURGICAL HISTORY:  No past surgical history on file.  SOCIAL HISTORY:   Social History   Social History  . Marital status: Married    Spouse name: N/A  . Number of children: N/A  . Years of education: N/A   Occupational History  . Not on file.   Social History Main Topics  . Smoking status: Never Smoker  . Smokeless tobacco: Not on file  . Alcohol use No  . Drug use: Unknown  . Sexual activity: Not on file   Other Topics Concern  . Not on file   Social History Narrative  . No narrative on file    FAMILY HISTORY:   No family status information on file.    ROS:  A complete 10 system review of systems was obtained and was unremarkable apart from what is mentioned above.  PHYSICAL EXAMINATION:    VITALS:  There were no vitals filed for this visit.  GEN:  The patient appears stated age and is in NAD. HEENT:  Normocephalic, atraumatic.  The mucous membranes are moist. The superficial temporal arteries are without ropiness or tenderness. CV:  RRR Lungs:  CTAB Neck/HEME:  There are no carotid bruits bilaterally.  Neurological examination:  Orientation: The patient is alert and oriented x3. Fund of knowledge is appropriate.  Recent and remote memory are intact.  Attention and concentration are normal.    Able to name objects and repeat phrases. Cranial nerves: There is good facial symmetry. Pupils are equal round and reactive to light  bilaterally. Fundoscopic exam reveals clear margins bilaterally. Extraocular muscles are intact. The visual fields are full to confrontational testing. The speech is fluent and clear. Soft palate rises symmetrically and there is no tongue deviation. Hearing is intact to conversational tone. Sensation: Sensation is intact to light and pinprick throughout (facial, trunk, extremities). Vibration is intact at the bilateral big toe. There is no extinction with double simultaneous stimulation. There is no sensory dermatomal level identified. Motor: Strength is 5/5 in the bilateral upper and lower extremities.   Shoulder shrug is equal and symmetric.  There is no pronator drift. Deep tendon reflexes: Deep tendon reflexes are 2/4 at the bilateral biceps, triceps, brachioradialis, patella and achilles. Plantar responses are downgoing bilaterally.  Movement examination: Tone: There is ***tone in the bilateral upper extremities.  The tone in the lower extremities is ***.  Abnormal movements: *** Coordination:  There is *** decremation with RAM's, *** Gait and Station: The patient has *** difficulty arising out of a deep-seated chair without the use of the hands. The patient's stride length is ***.  The patient has a *** pull test.      ASSESSMENT/PLAN:  ***  2.  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  He did not follow through with this.  I would strongly encourage him to do this as there are strong implications associated with sleep apnea, including increased morbidity and mortality.  3.  Depression  -He is following with psychiatry and psychology at [facility].  4.  Dysphagia  -Patient had a video barium swallow study in February, 2017 that was normal.  It was felt that his complaints were more esophageal in nature.  He did see  ENT at [facility] around that same time.  ***This did not include the 40 min of record review which was detailed above, which was non face to face time.  Cc:  Kandyce Rud, MD

## 2017-05-28 ENCOUNTER — Ambulatory Visit: Payer: Managed Care, Other (non HMO) | Admitting: Neurology

## 2017-06-06 ENCOUNTER — Encounter: Payer: Self-pay | Admitting: Neurology

## 2017-06-12 NOTE — Progress Notes (Signed)
Jay Saunders was seen today in the movement disorders clinic for neurologic consultation for the evaluation of Parkinson's disease.  The patient has previously seen Dr. Sherryll Burger, Dr. Ardyth Man and Dr. Fidela Juneau.  I have reviewed records made available to me.  He began to see Dr. Ardyth Man in May, 2015.  Symptoms began at the end of 2014.  His first symptom was noticing that his left arm was flexed against his body when he would walk and decreased arm swing.  He saw Dr. Sherryll Burger initially who recommended levodopa.  He sought a second opinion in Dr. Ardyth Man, who also recommended levodopa.  He started levodopa in May, 2015 at the age of 56 years old.  His PD has been complicated by depression and what wife describes as "emotional instability." He has tried zoloft, wellbutrin, effexor, paxil, adderrall (reports he has been on that x 31 years).    He has EDS.  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  He did not follow through with this.    He is currently on carbidopa/levodopa 25/100 2 tablets at 6:30 am, 1 tablet at 11 am, 1.5 tablets at 3 pm, 1 tablet at 7 pm and 1 at bedtime (this may not be exact dosing schedule but is 6-7 per day),  selegeline transdermal 6mg  daily - 2 per day, klonopin - 0.5 mg q hs.     Specific Symptoms:  Tremor: Yes.  , minimal tremor on the L at night in the hand with stress Family hx of similar:  Yes.  , father and grandfather Voice: no change per patient; wife states mumbles more and tapers at the end Sleep: sleeping well now but was having trouble - once added klonopin - 0.5 mg at night, this helped.  Once lays down, he gets drainage down throat and he has dry heaves and coughing and wife states that sleeps through a lot of it.  Some gagging/coughing in day so patient doesn't want to proceed with PSG  Vivid Dreams:  No.  Acting out dreams:  No. Wet  Pillows: Yes.   Postural symptoms:  minimal  Falls?  No. Bradykinesia symptoms: slow movements; may drag the L leg Loss of smell:  No. Loss of taste:  No. Urinary Incontinence:  No. Difficulty Swallowing:  Little bit but rare Handwriting, micrographia: just a little but more messy than in the past Trouble with ADL's:  No. but he is slower  Trouble buttoning clothing: Yes.   but he is slower when using the left hand Memory changes:  Yes.  , worse with periods of "cognitive impairment and high stress" Hallucinations:  No.  visual distortions: No. (occasionally a spot) N/V:  No. Lightheaded:  No.  Syncope: one time with stomach virus and dehyration Diplopia:  No. Dyskinesia:  No.   PREVIOUS MEDICATIONS: Sinemet and selegeline  ALLERGIES:  No Known Allergies  CURRENT MEDICATIONS:  Outpatient Encounter Prescriptions as of 06/14/2017  Medication Sig  . amphetamine-dextroamphetamine (ADDERALL) 10 MG tablet 2 1/2 tabs by mouth every morning and afternoon, take 1/2 tablet by mouth every late afternoon and every evening. (6 total tabs per day) Earliest Fill Date: 06/13/17  . betamethasone dipropionate (DIPROLENE) 0.05 % cream Apply topically.  . carbidopa-levodopa (SINEMET IR) 25-100 MG tablet 2 in the morning, 1 at 2 pm, 1 at 4 pm, 1.5 at bedtime, extra 1/2 doses 2 times daily  .  carvedilol (COREG) 12.5 MG tablet Take 6.25 mg by mouth.   . clonazePAM (KLONOPIN) 0.5 MG tablet Take 0.5 mg by mouth daily.  . furosemide (LASIX) 40 MG tablet Take by mouth.  . losartan-hydrochlorothiazide (HYZAAR) 50-12.5 MG tablet Take by mouth.  . selegiline (EMSAM) 6 MG/24HR Place 12 mg onto the skin daily.   . sildenafil (REVATIO) 20 MG tablet Take by mouth as needed.  . [DISCONTINUED] LORazepam (ATIVAN) 0.5 MG tablet TAKE 1/2-1 TABLET AT BEDTIME AS NEEDED FOR SLEEP   No facility-administered encounter medications on file as of 06/14/2017.     PAST MEDICAL HISTORY:   Past Medical History:  Diagnosis  Date  . Hypertension   . Narcolepsy   . Parkinson's disease (HCC)     PAST SURGICAL HISTORY:   Past Surgical History:  Procedure Laterality Date  . VARICOSE VEIN SURGERY    . VASECTOMY      SOCIAL HISTORY:   Social History   Social History  . Marital status: Married    Spouse name: N/A  . Number of children: N/A  . Years of education: N/A   Occupational History  . Not on file.   Social History Main Topics  . Smoking status: Never Smoker  . Smokeless tobacco: Never Used  . Alcohol use No  . Drug use: No  . Sexual activity: Not on file   Other Topics Concern  . Not on file   Social History Narrative  . No narrative on file    FAMILY HISTORY:   Family Status  Relation Status  . Mother Deceased  . Father Deceased  . Sister Alive  . Brother Alive  . Child Alive    ROS:  Admits to diaphoresis.  A complete 10 system review of systems was obtained and was unremarkable apart from what is mentioned above.  PHYSICAL EXAMINATION:    VITALS:   Vitals:   06/14/17 1008  BP: 130/80  Pulse: (!) 115  SpO2: 95%  Weight: 282 lb (127.9 kg)  Height: 6' (1.829 m)    GEN:  The patient appears stated age and is in NAD. HEENT:  Normocephalic, atraumatic.  The mucous membranes are moist. The superficial temporal arteries are without ropiness or tenderness. CV:  Tachycardic.  Regular.   Lungs:  CTAB Neck/HEME:  There are no carotid bruits bilaterally. Skin:  He is diaphoretic  Neurological examination:  Orientation: The patient is alert and oriented x3. Fund of knowledge is appropriate.  Recent and remote memory are intact.  Attention and concentration are normal.    Able to name objects and repeat phrases. Cranial nerves: There is good facial symmetry. There is mild facial hypomimia.  Pupils are equal round and reactive to light bilaterally. Fundoscopic exam reveals clear margins bilaterally. Extraocular muscles are intact. The visual fields are full to confrontational  testing. The speech is fluent and clear. Soft palate rises symmetrically and there is no tongue deviation. Hearing is intact to conversational tone. Sensation: Sensation is intact to light and pinprick throughout (facial, trunk, extremities). Vibration is intact at the bilateral big toe. There is no extinction with double simultaneous stimulation. There is no sensory dermatomal level identified. Motor: Strength is 5/5 in the bilateral upper and lower extremities.   Shoulder shrug is equal and symmetric.  There is no pronator drift. Deep tendon reflexes: Deep tendon reflexes are 2/4 at the bilateral biceps, triceps, brachioradialis, patella and achilles. Plantar responses are downgoing bilaterally.  Movement examination: Tone: There is normal tone in  the bilateral upper extremities.  The tone in the lower extremities is normal.  Abnormal movements: There is LUE mild tremor only with distraction procedures Coordination:  There is decremation with RAM's, with finger taps and toe taps on the L.  All other RAMs are normal,  including alternating supination and pronation of the forearm, hand opening and closing, heel taps and toe taps.  All RAMs are normal on the right Gait and Station: The patient has no difficulty arising out of a deep-seated chair without the use of the hands. The patient's stride length is normal with good arm swing.  The patient has a negative pull test.      ASSESSMENT/PLAN:  1.  Idiopathic Parkinson's disease.  The patient has tremor, bradykinesia, rigidity and mild postural instability.  -We discussed the diagnosis as well as pathophysiology of the disease.  We discussed treatment options as well as prognostic indicators.  Patient education was provided.  -We discussed that it used to be thought that levodopa would increase risk of melanoma but now it is believed that Parkinsons itself likely increases risk of melanoma. he is to get regular skin checks.  -Greater than 50% of the 80  minute visit was spent in counseling answering questions and talking about what to expect now as well as in the future.  We talked about the importance of exercise  -he has never been on an agonist drug.  He looked really great from a motor standpoint today but reports that I saw him at a good time.  Wife reports that he drags the L leg.  Agonist med could be of value in the future and there is some evidence that these, independently, can help mood and depression  2.  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  He did not follow through with this.  I would strongly encourage him to do this as there are strong implications associated with sleep apnea, including increased morbidity and mortality.  He was agreeable for me to do this.    3.  Depression  -He is following with psychiatry and psychology at [facility].  Much related to job stress.  Job may be coming to an end in the next 45 days.  This is affecting him physically and emotionally and I would not change meds until he figures out the outcome of the job situation.  He agrees.  4.  Dysphagia  -Patient had a video barium swallow study in February, 2017 that was normal.  It was felt that his complaints were more esophageal in nature.  He did see ENT at [facility] around that same time.  5.  Diaphoresis  -may get use out of clonidine.  He wants to think about that  6.  Sexual dysfunction  -will refer to urology at [facility] (has been on viagra)  7.  Follow up is anticipated in the next few months, sooner should new neurologic issues arise.  Face to face time did not include the 40 min of record review which was detailed above, which was non face to face time.  Cc:  Kandyce Rud, MD

## 2017-06-14 ENCOUNTER — Ambulatory Visit (INDEPENDENT_AMBULATORY_CARE_PROVIDER_SITE_OTHER): Payer: BLUE CROSS/BLUE SHIELD | Admitting: Neurology

## 2017-06-14 ENCOUNTER — Encounter: Payer: Self-pay | Admitting: Neurology

## 2017-06-14 ENCOUNTER — Telehealth: Payer: Self-pay | Admitting: Neurology

## 2017-06-14 VITALS — BP 130/80 | HR 115 | Ht 72.0 in | Wt 282.0 lb

## 2017-06-14 DIAGNOSIS — R1319 Other dysphagia: Secondary | ICD-10-CM | POA: Diagnosis not present

## 2017-06-14 DIAGNOSIS — R61 Generalized hyperhidrosis: Secondary | ICD-10-CM

## 2017-06-14 DIAGNOSIS — R400 Somnolence: Secondary | ICD-10-CM | POA: Diagnosis not present

## 2017-06-14 DIAGNOSIS — G47419 Narcolepsy without cataplexy: Secondary | ICD-10-CM

## 2017-06-14 DIAGNOSIS — G20 Parkinson's disease: Secondary | ICD-10-CM

## 2017-06-14 DIAGNOSIS — F330 Major depressive disorder, recurrent, mild: Secondary | ICD-10-CM | POA: Diagnosis not present

## 2017-06-14 NOTE — Telephone Encounter (Signed)
Spoke with Trey PaulaJeff, RN with Dr. Mindi JunkerSpector, who will talk with Dr. Mindi JunkerSpector (prior sleep neurologist) about sleep study. He may go ahead and order testing since he wanted this done a year ago and will manage Adderall pre-procedure.  Awaiting call back from WallandJeff [phone]).

## 2017-06-15 NOTE — Telephone Encounter (Signed)
[facility] also [facility] for [facility] evaluation of sexual dysfunction. Faxed to new patient appt request at [phone] with confirmation received. They will call patient to schedule appt.

## 2017-06-19 NOTE — Telephone Encounter (Signed)
Per [facility] records Dr. Sanjuana LettersSpector's office contacted patient to schedule sleep study.

## 2017-06-20 ENCOUNTER — Telehealth: Payer: Self-pay | Admitting: Neurology

## 2017-06-20 NOTE — Telephone Encounter (Signed)
[facility] called in regards to pt and said the sleep study has been ordered

## 2017-06-20 NOTE — Telephone Encounter (Signed)
Noted  

## 2017-09-27 NOTE — Progress Notes (Deleted)
Jay Saunders was seen today in the movement disorders clinic for neurologic consultation for the evaluation of Parkinson's disease.  The patient has previously seen Dr. Sherryll BurgerShah, Dr. Ardyth ManHickey and Dr. Fidela Juneauooney.  I have reviewed records made available to me.  He began to see Dr. Ardyth ManHickey in May, 2015.  Symptoms began at the end of 2014.  His first symptom was noticing that his left arm was flexed against his body when he would walk and decreased arm swing.  He saw Dr. Sherryll BurgerShah initially who recommended levodopa.  He sought a second opinion in Dr. Ardyth ManHickey, who also recommended levodopa.  He started levodopa in May, 2015 at the age of 56 years old.  His PD has been complicated by depression and what wife describes as "emotional instability." He has tried zoloft, wellbutrin, effexor, paxil, adderrall (reports he has been on that x 31 years).    He has EDS.  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  He did not follow through with this.    He is currently on carbidopa/levodopa 25/100 2 tablets at 6:30 am, 1 tablet at 11 am, 1.5 tablets at 3 pm, 1 tablet at 7 pm and 1 at bedtime (this may not be exact dosing schedule but is 6-7 per day),  selegeline transdermal 6mg  daily - 2 per day, klonopin - 0.5 mg q hs.     Specific Symptoms:  Tremor: Yes.  , minimal tremor on the L at night in the hand with stress Family hx of similar:  Yes.  , father and grandfather Voice: no change per patient; wife states mumbles more and tapers at the end Sleep: sleeping well now but was having trouble - once added klonopin - 0.5 mg at night, this helped.  Once lays down, he gets drainage down throat and he has dry heaves and coughing and wife states that sleeps through a lot of it.  Some gagging/coughing in day so patient doesn't want to proceed with PSG  Vivid Dreams:  No.  Acting out dreams:  No. Wet  Pillows: Yes.   Postural symptoms:  minimal  Falls?  No. Bradykinesia symptoms: slow movements; may drag the L leg Loss of smell:  No. Loss of taste:  No. Urinary Incontinence:  No. Difficulty Swallowing:  Little bit but rare Handwriting, micrographia: just a little but more messy than in the past Trouble with ADL's:  No. but he is slower  Trouble buttoning clothing: Yes.   but he is slower when using the left hand Memory changes:  Yes.  , worse with periods of "cognitive impairment and high stress" Hallucinations:  No.  visual distortions: No. (occasionally a spot) N/V:  No. Lightheaded:  No.  Syncope: one time with stomach virus and dehyration Diplopia:  No. Dyskinesia:  No.  09/28/17 update:  Pt seen in f/u for PD.  This patient is accompanied in the office by his {companion:[id number]} who supplements the history.  He is on carbidopa/levodopa 25/100, 2 tablets at 6:30 AM, 1 tablet at 11 AM, 1-1/2 tablets at 3 PM, 1 tablet at 7 PM and 1 at bedtime.  He is on selegiline transdermal 6 mg daily, 2/day.  He is also on clonazepam 0.5 mg at night.  He was supposed to have a nocturnal polysomnogram completed since last visit.  [facility] called us on June 20, 2017 stating that they had  called the patient to schedule this.  I do not see that this was ever completed.  Likewise, he was supposed to be seen at [facility] urology for sexual dysfunction and I do not see that he was seen there.   PREVIOUS MEDICATIONS: Sinemet and selegeline  ALLERGIES:  No Known Allergies  CURRENT MEDICATIONS:  Outpatient Encounter Medications as of 09/28/2017  Medication Sig  . amphetamine-dextroamphetamine (ADDERALL) 10 MG tablet 2 1/2 tabs by mouth every morning and afternoon, take 1/2 tablet by mouth every late afternoon and every evening. (6 total tabs per day) Earliest Fill Date: 06/13/17  . carbidopa-levodopa (SINEMET IR) 25-100 MG tablet 2 in the morning, 1 at 2 pm, 1 at 4 pm, 1.5 at bedtime, extra 1/2  doses 2 times daily  . carvedilol (COREG) 12.5 MG tablet Take 6.25 mg by mouth.   . clonazePAM (KLONOPIN) 0.5 MG tablet Take 0.5 mg by mouth daily.  . furosemide (LASIX) 40 MG tablet Take by mouth.  . losartan-hydrochlorothiazide (HYZAAR) 50-12.5 MG tablet Take by mouth.  . selegiline (EMSAM) 6 MG/24HR Place 12 mg onto the skin daily.   . sildenafil (REVATIO) 20 MG tablet Take by mouth as needed.   No facility-administered encounter medications on file as of 09/28/2017.     PAST MEDICAL HISTORY:   Past Medical History:  Diagnosis Date  . Hypertension   . Narcolepsy   . Parkinson's disease (HCC)     PAST SURGICAL HISTORY:   Past Surgical History:  Procedure Laterality Date  . VARICOSE VEIN SURGERY    . VASECTOMY      SOCIAL HISTORY:   Social History   Socioeconomic History  . Marital status: Married    Spouse name: Not on file  . Number of children: Not on file  . Years of education: Not on file  . Highest education level: Not on file  Social Needs  . Financial resource strain: Not on file  . Food insecurity - worry: Not on file  . Food insecurity - inability: Not on file  . Transportation needs - medical: Not on file  . Transportation needs - non-medical: Not on file  Occupational History  . Not on file  Tobacco Use  . Smoking status: Never Smoker  . Smokeless tobacco: Never Used  Substance and Sexual Activity  . Alcohol use: No  . Drug use: No  . Sexual activity: Not on file  Other Topics Concern  . Not on file  Social History Narrative  . Not on file    FAMILY HISTORY:   Family Status  Relation Name Status  . Mother  Deceased  . Father  Deceased  . Sister  Alive  . Brother  Alive  . Child x4 Alive    ROS:  Admits to diaphoresis.  A complete 10 system review of systems was obtained and was unremarkable apart from what is mentioned above.  PHYSICAL EXAMINATION:    VITALS:   There were no vitals filed for this visit.  GEN:  The patient appears  stated age and is in NAD. HEENT:  Normocephalic, atraumatic.  The mucous membranes are moist. The superficial temporal arteries are without ropiness or tenderness. CV:  Tachycardic.  Regular.   Lungs:  CTAB Neck/HEME:  There are no carotid bruits bilaterally. Skin:  He is diaphoretic  Neurological examination:  Orientation: The patient is alert and oriented x3. Fund of knowledge is appropriate.  Recent and remote memory are intact.  Attention and concentration are normal.  Able to name objects and repeat phrases. Cranial nerves: There is good facial symmetry. There is mild facial hypomimia.  Pupils are equal round and reactive to light bilaterally. Fundoscopic exam reveals clear margins bilaterally. Extraocular muscles are intact. The visual fields are full to confrontational testing. The speech is fluent and clear. Soft palate rises symmetrically and there is no tongue deviation. Hearing is intact to conversational tone. Sensation: Sensation is intact to light and pinprick throughout (facial, trunk, extremities). Vibration is intact at the bilateral big toe. There is no extinction with double simultaneous stimulation. There is no sensory dermatomal level identified. Motor: Strength is 5/5 in the bilateral upper and lower extremities.   Shoulder shrug is equal and symmetric.  There is no pronator drift. Deep tendon reflexes: Deep tendon reflexes are 2/4 at the bilateral biceps, triceps, brachioradialis, patella and achilles. Plantar responses are downgoing bilaterally.  Movement examination: Tone: There is normal tone in the bilateral upper extremities.  The tone in the lower extremities is normal.  Abnormal movements: There is LUE mild tremor only with distraction procedures Coordination:  There is decremation with RAM's, with finger taps and toe taps on the L.  All other RAMs are normal,  including alternating supination and pronation of the forearm, hand opening and closing, heel taps and toe  taps.  All RAMs are normal on the right Gait and Station: The patient has no difficulty arising out of a deep-seated chair without the use of the hands. The patient's stride length is normal with good arm swing.  The patient has a negative pull test.      ASSESSMENT/PLAN:  1.  Idiopathic Parkinson's disease.  The patient has tremor, bradykinesia, rigidity and mild postural instability.  -We discussed the diagnosis as well as pathophysiology of the disease.  We discussed treatment options as well as prognostic indicators.  Patient education was provided.  -We discussed that it used to be thought that levodopa would increase risk of melanoma but now it is believed that Parkinsons itself likely increases risk of melanoma. he is to get regular skin checks.  -he has never been on an agonist drug.  He looked really great from a motor standpoint today but reports that I saw him at a good time.  Wife reports that he drags the L leg.  Agonist med could be of value in the future and there is some evidence that these, independently, can help mood and depression  2.  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  He did not follow through with this. ***I referred him again last visit and it appears that they ordered a home study and he did not follow through with that.  3.  Depression  -[facility]  ***Does not appear that he has followed through with psychiatry since our last visit.  4.  Dysphagia  -Patient had a video barium swallow study in February, 2017 that was normal.  It was felt that his complaints were more esophageal in nature.  [facility].  5.  Diaphoresis  -may get use out of clonidine.  He wants to think about that  6.  Sexual  dysfunction  -***[facility]  I do not see that he ever went to  that appointment.  7.  Follow up is anticipated in the next few months, sooner should new neurologic issues arise.  Face to face time did not include the 40 min of record review which was detailed above, which was non face to face time.  Cc:  Kandyce Rud, MD

## 2017-09-28 ENCOUNTER — Ambulatory Visit: Payer: Managed Care, Other (non HMO) | Admitting: Neurology
